# Patient Record
Sex: Male | Born: 1980 | Race: White | Hispanic: No | Marital: Single | Smoking: Current every day smoker
Health system: Southern US, Community
[De-identification: ages and names within clinical notes are randomized; demographics above are authoritative.]

## PROBLEM LIST (undated history)

## (undated) DIAGNOSIS — F319 Bipolar disorder, unspecified: Secondary | ICD-10-CM

## (undated) DIAGNOSIS — F410 Panic disorder [episodic paroxysmal anxiety] without agoraphobia: Secondary | ICD-10-CM

## (undated) DIAGNOSIS — G4730 Sleep apnea, unspecified: Secondary | ICD-10-CM

## (undated) DIAGNOSIS — F32A Depression, unspecified: Secondary | ICD-10-CM

## (undated) DIAGNOSIS — K219 Gastro-esophageal reflux disease without esophagitis: Secondary | ICD-10-CM

## (undated) DIAGNOSIS — E785 Hyperlipidemia, unspecified: Secondary | ICD-10-CM

## (undated) HISTORY — PX: ORIF RADIAL FRACTURE: SHX5113

## (undated) HISTORY — PX: SEPTOPLASTY: SUR1290

## (undated) HISTORY — PX: TONSILLECTOMY: SUR1361

## (undated) HISTORY — PX: ORIF CLAVICLE FRACTURE: SUR924

---

## 2007-01-06 HISTORY — PX: ORIF RADIAL FRACTURE: SHX5113

## 2007-01-06 HISTORY — PX: ORIF CLAVICLE FRACTURE: SUR924

## 2010-05-19 ENCOUNTER — Emergency Department (HOSPITAL_COMMUNITY): Payer: Worker's Compensation

## 2010-05-19 ENCOUNTER — Emergency Department (HOSPITAL_COMMUNITY)
Admission: EM | Admit: 2010-05-19 | Discharge: 2010-05-19 | Disposition: A | Discharge status: Home / Self Care | Payer: Worker's Compensation | Attending: Emergency Medicine | Admitting: Emergency Medicine

## 2010-05-19 DIAGNOSIS — IMO0002 Reserved for concepts with insufficient information to code with codable children: Secondary | ICD-10-CM | POA: Insufficient documentation

## 2010-05-19 DIAGNOSIS — M79609 Pain in unspecified limb: Secondary | ICD-10-CM | POA: Insufficient documentation

## 2010-05-19 DIAGNOSIS — Y990 Civilian activity done for income or pay: Secondary | ICD-10-CM | POA: Insufficient documentation

## 2010-05-19 DIAGNOSIS — M25579 Pain in unspecified ankle and joints of unspecified foot: Secondary | ICD-10-CM | POA: Insufficient documentation

## 2010-05-19 DIAGNOSIS — S9000XA Contusion of unspecified ankle, initial encounter: Secondary | ICD-10-CM | POA: Insufficient documentation

## 2010-05-19 DIAGNOSIS — S8010XA Contusion of unspecified lower leg, initial encounter: Secondary | ICD-10-CM | POA: Insufficient documentation

## 2010-05-19 DIAGNOSIS — M7989 Other specified soft tissue disorders: Secondary | ICD-10-CM | POA: Insufficient documentation

## 2012-02-19 IMAGING — CR DG TIBIA/FIBULA 2V*L*
4 series · 4 of 4 positions shown · non-contrast
Comparison: None.

CLINICAL DATA: Blunt trauma

LEFT TIBIA AND FIBULA - 2 VIEW

[t tib/fib ap left (1 of 2)]
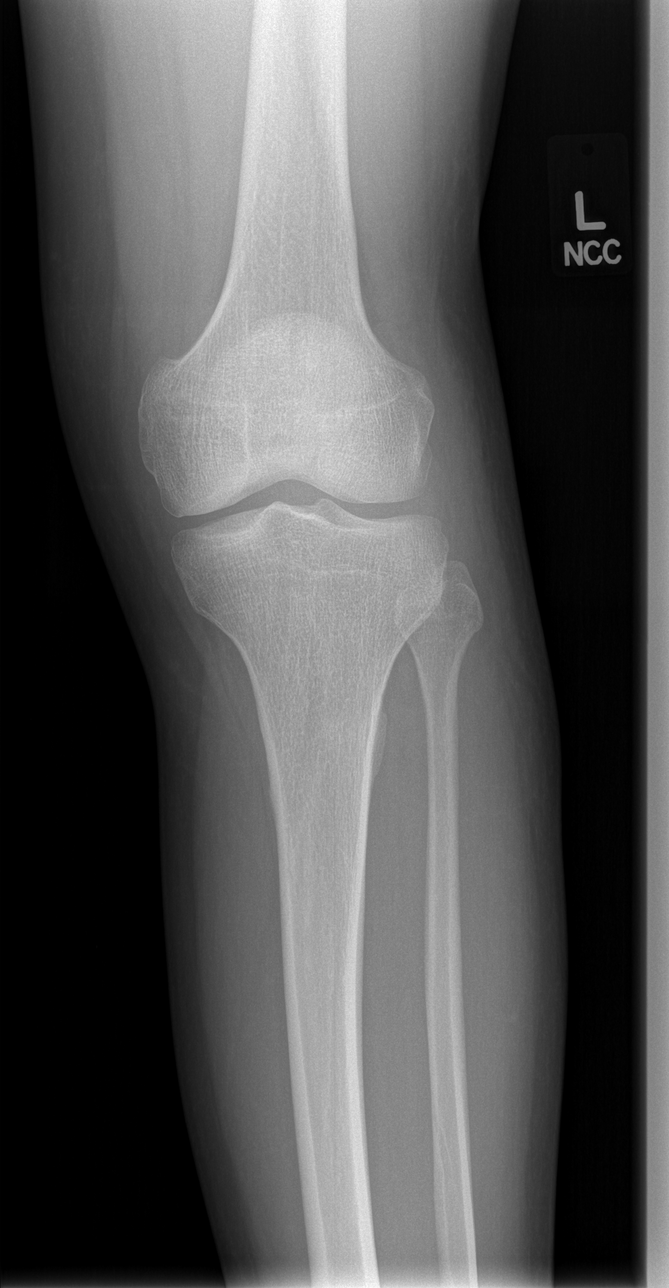

[t tib/fib ap left (2 of 2)]
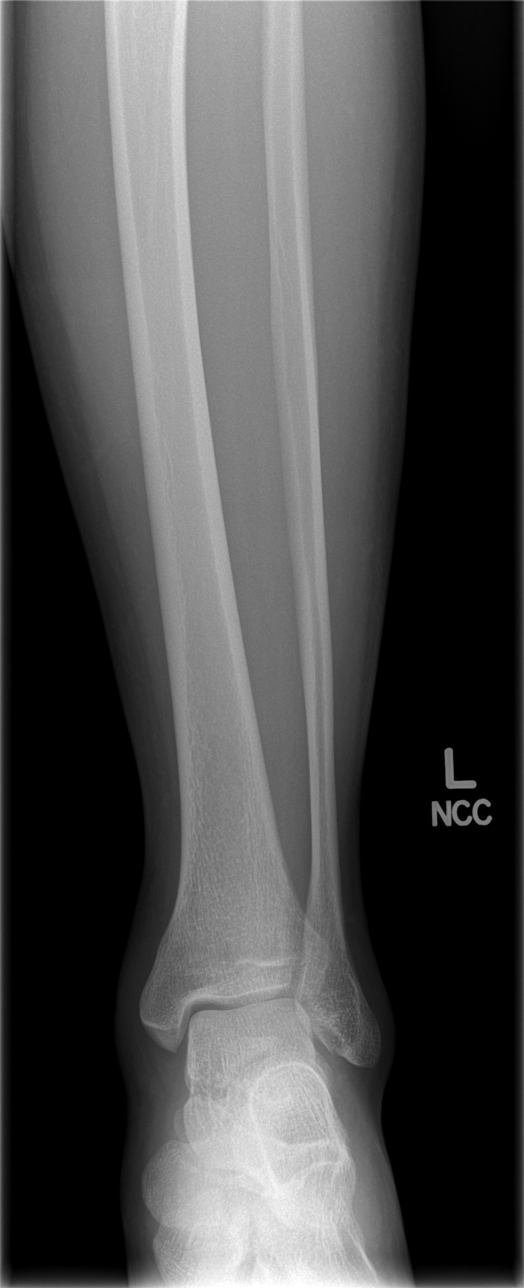

[t tib/fib lat left (1 of 2)]
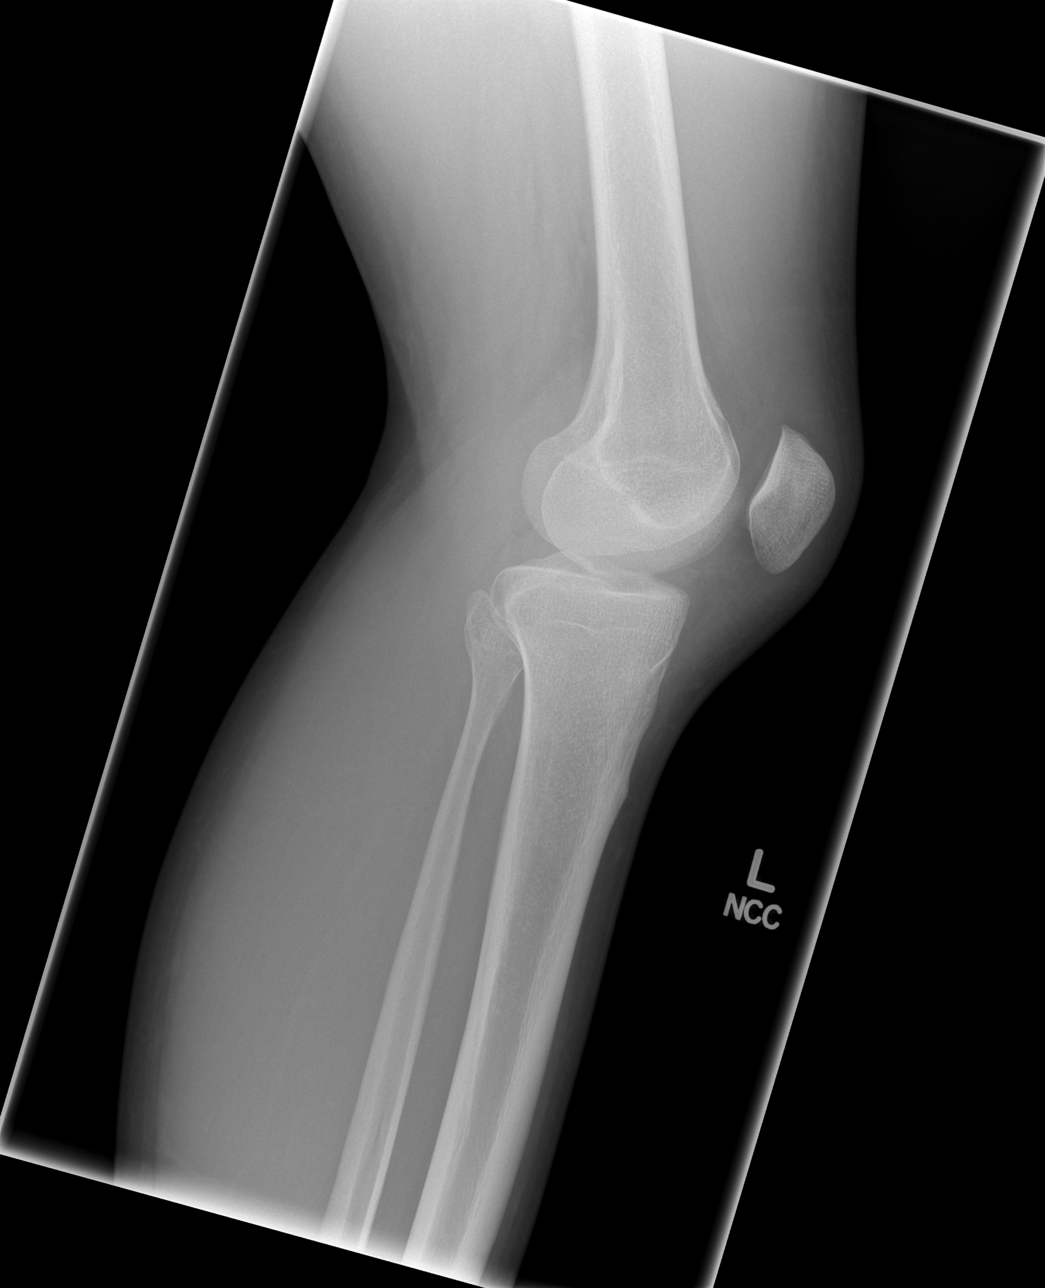

[t tib/fib lat left (2 of 2)]
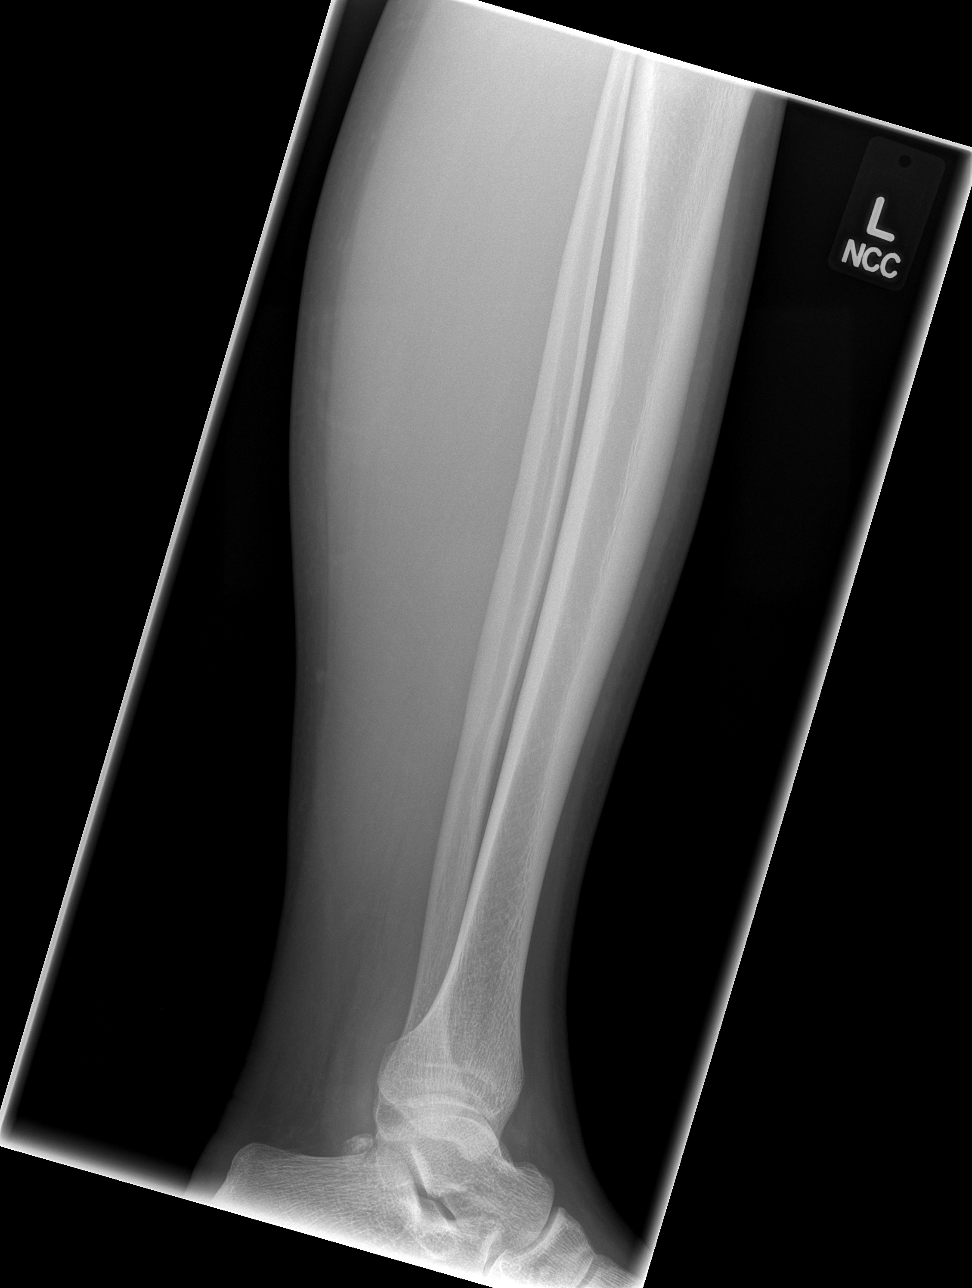

[4 of 4 positions shown; findings below may reference images not displayed]

FINDINGS: No acute fracture and no dislocation.  Unremarkable soft
tissues.
IMPRESSION: No acute bony injury.

## 2013-01-05 HISTORY — PX: SEPTOPLASTY: SUR1290

## 2018-01-05 HISTORY — PX: FRACTURE SURGERY: SHX138

## 2021-09-19 ENCOUNTER — Other Ambulatory Visit: Payer: Self-pay | Admitting: Otolaryngology

## 2021-09-22 ENCOUNTER — Encounter (HOSPITAL_BASED_OUTPATIENT_CLINIC_OR_DEPARTMENT_OTHER): Payer: Self-pay | Admitting: Otolaryngology

## 2021-09-22 ENCOUNTER — Other Ambulatory Visit: Payer: Self-pay

## 2021-10-01 ENCOUNTER — Encounter (HOSPITAL_BASED_OUTPATIENT_CLINIC_OR_DEPARTMENT_OTHER): Payer: Self-pay | Admitting: Otolaryngology

## 2021-10-01 ENCOUNTER — Other Ambulatory Visit: Payer: Self-pay

## 2021-10-01 ENCOUNTER — Ambulatory Visit (HOSPITAL_COMMUNITY)
Admission: RE | Admit: 2021-10-01 | Discharge: 2021-10-01 | Disposition: A | Discharge status: Home / Self Care | Payer: Medicare HMO | Attending: Otolaryngology | Admitting: Otolaryngology

## 2021-10-01 ENCOUNTER — Ambulatory Visit (HOSPITAL_BASED_OUTPATIENT_CLINIC_OR_DEPARTMENT_OTHER): Payer: Medicare HMO | Admitting: Anesthesiology

## 2021-10-01 ENCOUNTER — Encounter (HOSPITAL_BASED_OUTPATIENT_CLINIC_OR_DEPARTMENT_OTHER)
Admission: RE | Disposition: A | Discharge status: Home / Self Care | Payer: Self-pay | Source: Home / Self Care | Attending: Otolaryngology

## 2021-10-01 DIAGNOSIS — F17200 Nicotine dependence, unspecified, uncomplicated: Secondary | ICD-10-CM | POA: Diagnosis not present

## 2021-10-01 DIAGNOSIS — G4733 Obstructive sleep apnea (adult) (pediatric): Secondary | ICD-10-CM | POA: Insufficient documentation

## 2021-10-01 DIAGNOSIS — F319 Bipolar disorder, unspecified: Secondary | ICD-10-CM | POA: Insufficient documentation

## 2021-10-01 DIAGNOSIS — E669 Obesity, unspecified: Secondary | ICD-10-CM | POA: Insufficient documentation

## 2021-10-01 DIAGNOSIS — F419 Anxiety disorder, unspecified: Secondary | ICD-10-CM | POA: Diagnosis not present

## 2021-10-01 DIAGNOSIS — F17210 Nicotine dependence, cigarettes, uncomplicated: Secondary | ICD-10-CM

## 2021-10-01 DIAGNOSIS — K219 Gastro-esophageal reflux disease without esophagitis: Secondary | ICD-10-CM | POA: Insufficient documentation

## 2021-10-01 DIAGNOSIS — J392 Other diseases of pharynx: Secondary | ICD-10-CM | POA: Insufficient documentation

## 2021-10-01 HISTORY — PX: DRUG INDUCED ENDOSCOPY: SHX6808

## 2021-10-01 HISTORY — DX: Panic disorder (episodic paroxysmal anxiety): F41.0

## 2021-10-01 HISTORY — DX: Sleep apnea, unspecified: G47.30

## 2021-10-01 HISTORY — DX: Bipolar disorder, unspecified: F31.9

## 2021-10-01 HISTORY — DX: Hyperlipidemia, unspecified: E78.5

## 2021-10-01 SURGERY — DRUG INDUCED SLEEP ENDOSCOPY
Anesthesia: Monitor Anesthesia Care | Site: Nose | Laterality: Bilateral

## 2021-10-01 MED ORDER — LIDOCAINE HCL (CARDIAC) PF 100 MG/5ML IV SOSY
PREFILLED_SYRINGE | INTRAVENOUS | Status: DC | PRN
  Administered 2021-10-01: 40 mg via INTRAVENOUS

## 2021-10-01 MED ORDER — PROPOFOL 500 MG/50ML IV EMUL
INTRAVENOUS | Status: AC
  Filled 2021-10-01: qty 50

## 2021-10-01 MED ORDER — LACTATED RINGERS IV SOLN: INTRAVENOUS | Status: DC

## 2021-10-01 MED ORDER — LIDOCAINE-EPINEPHRINE 1 %-1:100000 IJ SOLN
INTRAMUSCULAR | Status: AC
  Filled 2021-10-01: qty 1

## 2021-10-01 MED ORDER — PROPOFOL 500 MG/50ML IV EMUL
INTRAVENOUS | Status: DC | PRN
  Administered 2021-10-01: 50 ug/kg/min via INTRAVENOUS

## 2021-10-01 MED ORDER — OXYMETAZOLINE HCL 0.05 % NA SOLN
NASAL | Status: AC
  Filled 2021-10-01: qty 30

## 2021-10-01 MED ORDER — ONDANSETRON HCL 4 MG/2ML IJ SOLN
INTRAMUSCULAR | Status: DC | PRN
  Administered 2021-10-01: 4 mg via INTRAVENOUS

## 2021-10-01 MED ORDER — OXYMETAZOLINE HCL 0.05 % NA SOLN
NASAL | Status: DC | PRN
  Administered 2021-10-01: 1 via TOPICAL

## 2021-10-01 MED ORDER — PROPOFOL 10 MG/ML IV BOLUS
INTRAVENOUS | Status: DC | PRN
  Administered 2021-10-01 (×2): 10 mg via INTRAVENOUS
  Administered 2021-10-01 (×2): 20 mg via INTRAVENOUS
  Administered 2021-10-01: 10 mg via INTRAVENOUS

## 2021-10-01 MED ORDER — LIDOCAINE 2% (20 MG/ML) 5 ML SYRINGE
INTRAMUSCULAR | Status: AC
  Filled 2021-10-01: qty 5

## 2021-10-01 SURGICAL SUPPLY — 12 items
CANISTER SUCT 1200ML W/VALVE (MISCELLANEOUS) ×1 IMPLANT
GLOVE BIO SURGEON STRL SZ7.5 (GLOVE) ×1 IMPLANT
KIT CLEAN ENDO (MISCELLANEOUS) ×1 IMPLANT
NDL HYPO 27GX1-1/4 (NEEDLE) IMPLANT
NEEDLE HYPO 27GX1-1/4 (NEEDLE) IMPLANT
PATTIES SURGICAL .5 X3 (DISPOSABLE) ×1 IMPLANT
SHEET MEDIUM DRAPE 40X70 STRL (DRAPES) ×1 IMPLANT
SOL ANTI FOG 6CC (MISCELLANEOUS) ×1 IMPLANT
SOLUTION ANTI FOG 6CC (MISCELLANEOUS) ×1
SYR CONTROL 10ML LL (SYRINGE) IMPLANT
TOWEL GREEN STERILE FF (TOWEL DISPOSABLE) ×1 IMPLANT
TUBE CONNECTING 20X1/4 (TUBING) ×1 IMPLANT

## 2021-10-01 NOTE — H&P (Signed)
Brian Pruitt is an 41 y.o. male.   Chief Complaint: Sleep apnea HPI: 41 year old male with obstructive sleep apnea who has been unable to tolerate CPAP.  Past Medical History:  Diagnosis Date   Bipolar disorder ([place])    HLD (hyperlipidemia)    Panic disorder    Sleep apnea     Past Surgical History:  Procedure Laterality Date   ORIF CLAVICLE FRACTURE     ORIF RADIAL FRACTURE     SEPTOPLASTY     TONSILLECTOMY      History reviewed. No pertinent family history. Social History:  reports that he has been smoking cigarettes. He uses smokeless tobacco. He reports current alcohol use. He reports that he does not use drugs.  Allergies:  Allergies  Allergen Reactions   Ceclor [Cefaclor]    Septra [Sulfamethoxazole-Trimethoprim]    Trimethoprim     Medications Prior to Admission  Medication Sig Dispense Refill   ALPRAZolam (XANAX) 0.5 MG tablet Take 0.5 mg by mouth at bedtime as needed for anxiety.     atorvastatin (LIPITOR) 40 MG tablet Take 40 mg by mouth daily.     cariprazine (VRAYLAR) 3 MG capsule Take by mouth.     divalproex (DEPAKOTE ER) 500 MG 24 hr tablet Take by mouth daily.     Multiple Vitamin (MULTIVITAMIN) capsule Take 1 capsule by mouth daily.     pantoprazole (PROTONIX) 40 MG tablet Take 40 mg by mouth daily.     QUEtiapine (SEROQUEL XR) 300 MG 24 hr tablet Take 300 mg by mouth at bedtime.     sertraline (ZOLOFT) 100 MG tablet Take 100 mg by mouth daily.      No results found for this or any previous visit (from the past 48 hour(s)). No results found.  Review of Systems  All other systems reviewed and are negative.   Blood pressure (!) 130/97, pulse (!) 108, temperature 98.2 F (36.8 C), temperature source Oral, resp. rate 18, height 5\' 11"  (1.803 m), weight 103.3 kg, SpO2 99 %. Physical Exam Constitutional:      Appearance: Normal appearance. He is normal weight.  HENT:     Head: Normocephalic and atraumatic.     Right Ear: External ear normal.      Left Ear: External ear normal.     Nose: Nose normal.     Mouth/Throat:     Mouth: Mucous membranes are moist.     Pharynx: Oropharynx is clear.  Eyes:     Extraocular Movements: Extraocular movements intact.     Conjunctiva/sclera: Conjunctivae normal.     Pupils: Pupils are equal, round, and reactive to light.  Cardiovascular:     Rate and Rhythm: Normal rate.  Pulmonary:     Effort: Pulmonary effort is normal.  Musculoskeletal:     Cervical back: Normal range of motion.  Skin:    General: Skin is warm and dry.  Neurological:     General: No focal deficit present.     Mental Status: He is alert and oriented to person, place, and time.  Psychiatric:        Mood and Affect: Mood normal.        Behavior: Behavior normal.        Thought Content: Thought content normal.        Judgment: Judgment normal.      Assessment/Plan Obstructive sleep apnea and BMI 31.76  To OR for sleep endoscopy.  Melida Quitter, MD 10/01/2021, 7:33 AM

## 2021-10-01 NOTE — Op Note (Signed)
Preop diagnosis: Obstructive sleep apnea Postop diagnosis: same Procedure: Drug-induced sleep endoscopy Surgeon: Caelie Remsburg Anesth: IV sedation Compl: None Findings: There is 100% anterior-posterior collapse at the velum making him a candidate for hypoglossal nerve stimulator placement.  There was also anterior-posterior collapse at the tongue base. Description:  After discussing risks, benefits, and alternatives, the patient was brought to the operative suite and placed on the operative table in the supine position.  Anesthesia was induced and the patient was given light sedation to simulate natural sleep. When the proper level was reached, an Afrin-soaked pledget was placed in the right nasal passage for a couple of minutes and then removed.  The fiberoptic laryngoscope was then passed to view the pharynx and larynx.  Findings are noted above and the exam was recorded.  After completion, the scope was removed and the patient was returned to anesthesia for wakeup and was moved to the recovery room in stable condition.  

## 2021-10-01 NOTE — Discharge Instructions (Signed)

## 2021-10-01 NOTE — Anesthesia Preprocedure Evaluation (Addendum)
Anesthesia Evaluation  Patient identified by MRN, date of birth, ID band Patient awake    Reviewed: Allergy & Precautions, NPO status , Patient's Chart, lab work & pertinent test results  Airway Mallampati: II  TM Distance: <3 FB Neck ROM: Full    Dental  (+) Teeth Intact, Dental Advisory Given   Pulmonary sleep apnea , Current Smoker and Patient abstained from smoking.,    Pulmonary exam normal breath sounds clear to auscultation       Cardiovascular negative cardio ROS Normal cardiovascular exam Rhythm:Regular Rate:Normal     Neuro/Psych PSYCHIATRIC DISORDERS Anxiety Bipolar Disorder negative neurological ROS     GI/Hepatic Neg liver ROS, GERD  Medicated,  Endo/Other  negative endocrine ROSObesity   Renal/GU negative Renal ROS     Musculoskeletal negative musculoskeletal ROS (+)   Abdominal   Peds  Hematology negative hematology ROS (+)   Anesthesia Other Findings Day of surgery medications reviewed with the patient.  Reproductive/Obstetrics                            Anesthesia Physical Anesthesia Plan  ASA: 2  Anesthesia Plan: MAC   Post-op Pain Management: Minimal or no pain anticipated   Induction: Intravenous  PONV Risk Score and Plan: 0 and TIVA and Treatment may vary due to age or medical condition  Airway Management Planned: Nasal Cannula and Natural Airway  Additional Equipment:   Intra-op Plan:   Post-operative Plan:   Informed Consent: I have reviewed the patients History and Physical, chart, labs and discussed the procedure including the risks, benefits and alternatives for the proposed anesthesia with the patient or authorized representative who has indicated his/her understanding and acceptance.     Dental advisory given  Plan Discussed with: CRNA and Anesthesiologist  Anesthesia Plan Comments:         Anesthesia Quick Evaluation

## 2021-10-01 NOTE — Anesthesia Postprocedure Evaluation (Signed)
Anesthesia Post Note  Patient: Brian Pruitt  Procedure(s) Performed: DRUG INDUCED ENDOSCOPY (Bilateral: Nose)     Patient location during evaluation: PACU Anesthesia Type: MAC Level of consciousness: awake and alert Pain management: pain level controlled Vital Signs Assessment: post-procedure vital signs reviewed and stable Respiratory status: spontaneous breathing, nonlabored ventilation, respiratory function stable and patient connected to nasal cannula oxygen Cardiovascular status: stable and blood pressure returned to baseline Postop Assessment: no apparent nausea or vomiting Anesthetic complications: no   No notable events documented.  Last Vitals:  Vitals:   10/01/21 0800 10/01/21 0817  BP: 137/76 (!) 141/91  Pulse: (!) 102 87  Resp: 19 20  Temp:  36.8 C  SpO2: 94% 96%    Last Pain:  Vitals:   10/01/21 0817  TempSrc:   PainSc: 0-No pain                 Santa Lighter

## 2021-10-01 NOTE — Transfer of Care (Signed)
Immediate Anesthesia Transfer of Care Note  Patient: Brian Pruitt  Procedure(s) Performed: DRUG INDUCED ENDOSCOPY (Bilateral: Nose)  Patient Location: PACU  Anesthesia Type:MAC  Level of Consciousness: awake, alert  and oriented  Airway & Oxygen Therapy: Patient Spontanous Breathing and Patient connected to face mask oxygen  Post-op Assessment: Report given to RN and Post -op Vital signs reviewed and stable  Post vital signs: Reviewed and stable  Last Vitals:   BP: 132/81 (96) Vitals Value Taken Time  BP    Temp 36.7 C 10/01/21 0756  Pulse 105 10/01/21 0756  Resp    SpO2 98 % 10/01/21 0756    Last Pain:  Vitals:   10/01/21 0647  TempSrc: Oral  PainSc: 0-No pain      Patients Stated Pain Goal: 3 (67/73/73 6681)  Complications: No notable events documented.

## 2021-10-01 NOTE — Brief Op Note (Signed)
10/01/2021  8:01 AM  PATIENT:  Brian Pruitt  41 y.o. male  PRE-OPERATIVE DIAGNOSIS:  Obstructive Sleep Apnea BMI 32.0-32.9,adult  POST-OPERATIVE DIAGNOSIS:  Obstructive Sleep Apnea BMI 32.0-32.9,adult  PROCEDURE:  Procedure(s): DRUG INDUCED ENDOSCOPY (Bilateral)  SURGEON:  Surgeon(s) and Role:    Melida Quitter, MD - Primary  PHYSICIAN ASSISTANT:   ASSISTANTS: none   ANESTHESIA:   IV sedation  EBL:  None   BLOOD ADMINISTERED:none  DRAINS: none   LOCAL MEDICATIONS USED:  NONE  SPECIMEN:  No Specimen  DISPOSITION OF SPECIMEN:  N/A  COUNTS:  YES  TOURNIQUET:  * No tourniquets in log *  DICTATION: .Note written in EPIC  PLAN OF CARE: Discharge to home after PACU  PATIENT DISPOSITION:  PACU - hemodynamically stable.   Delay start of Pharmacological VTE agent (>24hrs) due to surgical blood loss or risk of bleeding: no

## 2021-10-02 ENCOUNTER — Encounter (HOSPITAL_BASED_OUTPATIENT_CLINIC_OR_DEPARTMENT_OTHER): Payer: Self-pay | Admitting: Otolaryngology

## 2021-10-29 ENCOUNTER — Other Ambulatory Visit: Payer: Self-pay | Admitting: Otolaryngology

## 2021-11-21 NOTE — Progress Notes (Signed)
Surgical Instructions    Your procedure is scheduled on Wednesday November 29.  Report to [facility] Main Entrance "A" at 7:45 A.M., then check in with the Admitting office.  Call this number if you have problems the morning of surgery:  [phone]   If you have any questions prior to your surgery date call [phone]: Open Monday-Friday 8am-4pm If you experience any cold or flu symptoms such as cough, fever, chills, shortness of breath, etc. between now and your scheduled surgery, please notify us at the above number     Remember:  Do not eat after midnight the night before your surgery  You may drink clear liquids until 6:45AM the morning of your surgery.   Clear liquids allowed are: Water, Non-Citrus Juices (without pulp), Carbonated Beverages, Clear Tea, Black Coffee ONLY (NO MILK, CREAM OR POWDERED CREAMER of any kind), and Gatorade    Take these medicines the morning of surgery with A SIP OF WATER: pantoprazole (PROTONIX)   sertraline (ZOLOFT)  ALPRAZolam (XANAX)  if needed   As of today, STOP taking any Aspirin (unless otherwise instructed by your surgeon) Aleve, Naproxen, Ibuprofen, Motrin, Advil, Goody's, BC's, all herbal medications, fish oil, and all vitamins.           [facility] is not responsible for any belongings or valuables.    Do NOT Smoke (Tobacco/Vaping)  24 hours prior to your procedure  If you use a CPAP at night, you may bring your mask for your overnight stay.   Contacts, glasses, hearing aids, dentures or partials may not be worn into surgery, please bring cases for these belongings   For patients admitted to the hospital, discharge time will be determined by your treatment team.   Patients discharged the day of surgery will not be allowed to drive home, and someone needs to stay with them for 24 hours.   SURGICAL WAITING ROOM VISITATION Patients having surgery or a procedure may have no more than 2 support people in the waiting area - these  visitors may rotate.   Children under the age of 16 must have an adult with them who is not the patient. If the patient needs to stay at the hospital during part of their recovery, the visitor guidelines for inpatient rooms apply. Pre-op nurse will coordinate an appropriate time for 1 support person to accompany patient in pre-op.  This support person may not rotate.   Please refer to [email or web address] for the visitor guidelines for Inpatients (after your surgery is over and you are in a regular room).    Special instructions:    Oral Hygiene is also important to reduce your risk of infection.  Remember - BRUSH YOUR TEETH THE MORNING OF SURGERY WITH YOUR REGULAR TOOTHPASTE   [facility]- Preparing For Surgery  Before surgery, you can play an important role. Because skin is not sterile, your skin needs to be as free of germs as possible. You can reduce the number of germs on your skin by washing with CHG (chlorahexidine gluconate) Soap before surgery.  CHG is an antiseptic cleaner which kills germs and bonds with the skin to continue killing germs even after washing.     Please do not use if you have an allergy to CHG or antibacterial soaps. If your skin becomes reddened/irritated stop using the CHG.  Do not shave (including legs and underarms) for at least 48 hours prior to first CHG shower. It is OK to shave your face.  Please follow these instructions   carefully.     Shower the NIGHT BEFORE SURGERY and the MORNING OF SURGERY with CHG Soap.   If you chose to wash your hair, wash your hair first as usual with your normal shampoo. After you shampoo, rinse your hair and body thoroughly to remove the shampoo.  Then [employer] and genitals (private parts) with your normal soap and rinse thoroughly to remove soap.  After that Use CHG Soap as you would any other liquid soap. You can apply CHG directly to the skin and wash gently with a scrungie  or a clean washcloth.   Apply the CHG Soap to your body ONLY FROM THE NECK DOWN.  Do not use on open wounds or open sores. Avoid contact with your eyes, ears, mouth and genitals (private parts). Wash Face and genitals (private parts)  with your normal soap.   Wash thoroughly, paying special attention to the area where your surgery will be performed.  Thoroughly rinse your body with warm water from the neck down.  DO NOT shower/wash with your normal soap after using and rinsing off the CHG Soap.  Pat yourself dry with a CLEAN TOWEL.  Wear CLEAN PAJAMAS to bed the night before surgery  Place CLEAN SHEETS on your bed the night before your surgery  DO NOT SLEEP WITH PETS.   Day of Surgery:  Take a shower with CHG soap. Wear Clean/Comfortable clothing the morning of surgery Do not wear jewelry or makeup. Do not wear lotions, powders, perfumes/cologne or deodorant. Do not shave 48 hours prior to surgery.  Men may shave face and neck. Do not bring valuables to the hospital. Do not wear nail polish, gel polish, artificial nails, or any other type of covering on natural nails (fingers and toes) If you have artificial nails or gel coating that need to be removed by a nail salon, please have this removed prior to surgery. Artificial nails or gel coating may interfere with anesthesia's ability to adequately monitor your vital signs. Remember to brush your teeth WITH YOUR REGULAR TOOTHPASTE.    If you received a COVID test during your pre-op visit, it is requested that you wear a mask when out in public, stay away from anyone that may not be feeling well, and notify your surgeon if you develop symptoms. If you have been in contact with anyone that has tested positive in the last 10 days, please notify your surgeon.    Please read over the following fact sheets that you were given.

## 2021-11-24 ENCOUNTER — Other Ambulatory Visit: Payer: Self-pay

## 2021-11-24 ENCOUNTER — Encounter (HOSPITAL_COMMUNITY): Payer: Self-pay

## 2021-11-24 ENCOUNTER — Encounter (HOSPITAL_COMMUNITY)
Admission: RE | Admit: 2021-11-24 | Discharge: 2021-11-24 | Disposition: A | Discharge status: Home / Self Care | Payer: Medicare HMO | Source: Ambulatory Visit | Attending: Otolaryngology | Admitting: Otolaryngology

## 2021-11-24 VITALS — BP 138/94 | HR 103 | Temp 97.7°F | Resp 18 | Ht 71.0 in | Wt 225.8 lb

## 2021-11-24 DIAGNOSIS — Z01818 Encounter for other preprocedural examination: Secondary | ICD-10-CM

## 2021-11-24 DIAGNOSIS — Z01812 Encounter for preprocedural laboratory examination: Secondary | ICD-10-CM | POA: Diagnosis present

## 2021-11-24 HISTORY — DX: Depression, unspecified: F32.A

## 2021-11-24 HISTORY — DX: Gastro-esophageal reflux disease without esophagitis: K21.9

## 2021-11-24 LAB — CBC
HCT: 46.7 % (ref 39.0–52.0)
Hemoglobin: 16 g/dL (ref 13.0–17.0)
MCH: 28.5 pg (ref 26.0–34.0)
MCHC: 34.3 g/dL (ref 30.0–36.0)
MCV: 83.2 fL (ref 80.0–100.0)
Platelets: 165 10*3/uL (ref 150–400)
RBC: 5.61 MIL/uL (ref 4.22–5.81)
RDW: 13.2 % (ref 11.5–15.5)
WBC: 3.7 10*3/uL — ABNORMAL LOW (ref 4.0–10.5)
nRBC: 0 % (ref 0.0–0.2)

## 2021-11-24 NOTE — Progress Notes (Signed)
PCP - Dr. Marvis Repress Cardiologist - Denies  PPM/ICD - Denies Device Orders - n/a Rep Notified - n/a  Chest x-ray - n/a EKG - Per pt, had one back in 2017 r/t CP which ended up being related to a panic attack. Stress Test - Denies ECHO - Denies Cardiac Cath - Denies  Sleep Study - Positive OSA 2021 CPAP - Unable to tolerate CPAP d/t night sweats and claustrophobia. Has not worn in months/already returned it to company.  No DM  Last dose of GLP1 agonist- n/a GLP1 instructions: n/a  Blood Thinner Instructions: n/a Aspirin Instructions: n/a  ERAS Protcol - Clear liquids until 52778 morning of surgery PRE-SURGERY Ensure or G2- n/a. None ordered  COVID TEST- n/a   Anesthesia review: No.  Patient denies shortness of breath, fever, cough and chest pain at PAT appointment   All instructions explained to the patient, with a verbal understanding of the material. Patient agrees to go over the instructions while at home for a better understanding. Patient also instructed to self quarantine after being tested for COVID-19. The opportunity to ask questions was provided.

## 2021-11-24 NOTE — Progress Notes (Signed)
Surgical Instructions    Your procedure is scheduled on Wednesday November 29.  Report to [facility] Main Entrance "A" at 7:45 A.M., then check in with the Admitting office.  Call this number if you have problems the morning of surgery:  [phone]   If you have any questions prior to your surgery date call [phone]: Open Monday-Friday 8am-4pm If you experience any cold or flu symptoms such as cough, fever, chills, shortness of breath, etc. between now and your scheduled surgery, please notify us at the above number     Remember:  Do not eat after midnight the night before your surgery  You may drink clear liquids until 6:45AM the morning of your surgery.   Clear liquids allowed are: Water, Non-Citrus Juices (without pulp), Carbonated Beverages, Clear Tea, Black Coffee ONLY (NO MILK, CREAM OR POWDERED CREAMER of any kind), and Gatorade    Take these medicines the morning of surgery with A SIP OF WATER: pantoprazole (PROTONIX)   sertraline (ZOLOFT)  ALPRAZolam (XANAX)  if needed   As of today, STOP taking any Aspirin (unless otherwise instructed by your surgeon) Aleve, Naproxen, Ibuprofen, Motrin, Advil, Goody's, BC's, all herbal medications, fish oil, and all vitamins.           [facility] is not responsible for any belongings or valuables.    Do NOT Smoke (Tobacco/Vaping)  24 hours prior to your procedure  If you use a CPAP at night, you may bring your mask for your overnight stay.   Contacts, glasses, hearing aids, dentures or partials may not be worn into surgery, please bring cases for these belongings   For patients admitted to the hospital, discharge time will be determined by your treatment team.   Patients discharged the day of surgery will not be allowed to drive home, and someone needs to stay with them for 24 hours.   SURGICAL WAITING ROOM VISITATION Patients having surgery or a procedure may have no more than 2 support people in the waiting area - these  visitors may rotate.   Children under the age of 81 must have an adult with them who is not the patient. If the patient needs to stay at the hospital during part of their recovery, the visitor guidelines for inpatient rooms apply. Pre-op nurse will coordinate an appropriate time for 1 support person to accompany patient in pre-op.  This support person may not rotate.   Please refer to [email or web address] for the visitor guidelines for Inpatients (after your surgery is over and you are in a regular room).    Special instructions:    Oral Hygiene is also important to reduce your risk of infection.  Remember - BRUSH YOUR TEETH THE MORNING OF SURGERY WITH YOUR REGULAR TOOTHPASTE   [facility]- Preparing For Surgery  Before surgery, you can play an important role. Because skin is not sterile, your skin needs to be as free of germs as possible. You can reduce the number of germs on your skin by washing with CHG (chlorahexidine gluconate) Soap before surgery.  CHG is an antiseptic cleaner which kills germs and bonds with the skin to continue killing germs even after washing.     Please do not use if you have an allergy to CHG or antibacterial soaps. If your skin becomes reddened/irritated stop using the CHG.  Do not shave (including legs and underarms) for at least 48 hours prior to first CHG shower. It is OK to shave your face.  Please follow these instructions  carefully.     Shower the NIGHT BEFORE SURGERY and the MORNING OF SURGERY with CHG Soap.   If you chose to wash your hair, wash your hair first as usual with your normal shampoo. After you shampoo, rinse your hair and body thoroughly to remove the shampoo.  Then [employer] and genitals (private parts) with your normal soap and rinse thoroughly to remove soap.  After that Use CHG Soap as you would any other liquid soap. You can apply CHG directly to the skin and wash gently with a scrungie  or a clean washcloth.   Apply the CHG Soap to your body ONLY FROM THE NECK DOWN.  Do not use on open wounds or open sores. Avoid contact with your eyes, ears, mouth and genitals (private parts). Wash Face and genitals (private parts)  with your normal soap.   Wash thoroughly, paying special attention to the area where your surgery will be performed.  Thoroughly rinse your body with warm water from the neck down.  DO NOT shower/wash with your normal soap after using and rinsing off the CHG Soap.  Pat yourself dry with a CLEAN TOWEL.  Wear CLEAN PAJAMAS to bed the night before surgery  Place CLEAN SHEETS on your bed the night before your surgery  DO NOT SLEEP WITH PETS.   Day of Surgery:  Take a shower with CHG soap. Wear Clean/Comfortable clothing the morning of surgery Do not wear jewelry or makeup. Do not wear lotions, powders, perfumes/cologne or deodorant. Do not shave 48 hours prior to surgery.  Men may shave face and neck. Do not bring valuables to the hospital. Do not wear nail polish, gel polish, artificial nails, or any other type of covering on natural nails (fingers and toes) If you have artificial nails or gel coating that need to be removed by a nail salon, please have this removed prior to surgery. Artificial nails or gel coating may interfere with anesthesia's ability to adequately monitor your vital signs. Remember to brush your teeth WITH YOUR REGULAR TOOTHPASTE.    If you received a COVID test during your pre-op visit, it is requested that you wear a mask when out in public, stay away from anyone that may not be feeling well, and notify your surgeon if you develop symptoms. If you have been in contact with anyone that has tested positive in the last 10 days, please notify your surgeon.    Please read over the following fact sheets that you were given.

## 2021-12-03 ENCOUNTER — Ambulatory Visit (HOSPITAL_BASED_OUTPATIENT_CLINIC_OR_DEPARTMENT_OTHER): Payer: Medicare HMO | Admitting: Anesthesiology

## 2021-12-03 ENCOUNTER — Ambulatory Visit (HOSPITAL_COMMUNITY): Payer: Medicare HMO | Admitting: Anesthesiology

## 2021-12-03 ENCOUNTER — Other Ambulatory Visit: Payer: Self-pay

## 2021-12-03 ENCOUNTER — Encounter (HOSPITAL_COMMUNITY)
Admission: RE | Disposition: A | Discharge status: Home / Self Care | Payer: Self-pay | Source: Ambulatory Visit | Attending: Otolaryngology

## 2021-12-03 ENCOUNTER — Ambulatory Visit (HOSPITAL_COMMUNITY)
Admission: RE | Admit: 2021-12-03 | Discharge: 2021-12-03 | Disposition: A | Discharge status: Home / Self Care | Payer: Medicare HMO | Source: Ambulatory Visit | Attending: Otolaryngology | Admitting: Otolaryngology

## 2021-12-03 ENCOUNTER — Encounter (HOSPITAL_COMMUNITY): Payer: Self-pay | Admitting: Otolaryngology

## 2021-12-03 ENCOUNTER — Ambulatory Visit (HOSPITAL_COMMUNITY): Payer: Medicare HMO

## 2021-12-03 DIAGNOSIS — G4733 Obstructive sleep apnea (adult) (pediatric): Secondary | ICD-10-CM | POA: Insufficient documentation

## 2021-12-03 DIAGNOSIS — F17210 Nicotine dependence, cigarettes, uncomplicated: Secondary | ICD-10-CM | POA: Insufficient documentation

## 2021-12-03 DIAGNOSIS — Z6831 Body mass index (BMI) 31.0-31.9, adult: Secondary | ICD-10-CM | POA: Diagnosis not present

## 2021-12-03 DIAGNOSIS — K219 Gastro-esophageal reflux disease without esophagitis: Secondary | ICD-10-CM | POA: Insufficient documentation

## 2021-12-03 DIAGNOSIS — F319 Bipolar disorder, unspecified: Secondary | ICD-10-CM | POA: Diagnosis not present

## 2021-12-03 DIAGNOSIS — F419 Anxiety disorder, unspecified: Secondary | ICD-10-CM | POA: Diagnosis not present

## 2021-12-03 HISTORY — PX: IMPLANTATION OF HYPOGLOSSAL NERVE STIMULATOR: SHX6827

## 2021-12-03 SURGERY — INSERTION, HYPOGLOSSAL NERVE STIMULATOR
Anesthesia: General | Site: Neck | Laterality: Right

## 2021-12-03 MED ORDER — HYDROMORPHONE HCL 1 MG/ML IJ SOLN
INTRAMUSCULAR | Status: AC
  Filled 2021-12-03: qty 0.5

## 2021-12-03 MED ORDER — FENTANYL CITRATE (PF) 100 MCG/2ML IJ SOLN
25.0000 ug | INTRAMUSCULAR | Status: DC | PRN
  Administered 2021-12-03: 50 ug via INTRAVENOUS

## 2021-12-03 MED ORDER — ACETAMINOPHEN 10 MG/ML IV SOLN
1000.0000 mg | Freq: Once | INTRAVENOUS | Status: DC | PRN
  Administered 2021-12-03: 1000 mg via INTRAVENOUS

## 2021-12-03 MED ORDER — ROCURONIUM BROMIDE 10 MG/ML (PF) SYRINGE
PREFILLED_SYRINGE | INTRAVENOUS | Status: AC
  Filled 2021-12-03: qty 10

## 2021-12-03 MED ORDER — PHENYLEPHRINE 80 MCG/ML (10ML) SYRINGE FOR IV PUSH (FOR BLOOD PRESSURE SUPPORT)
PREFILLED_SYRINGE | INTRAVENOUS | Status: DC | PRN
  Administered 2021-12-03: 80 ug via INTRAVENOUS

## 2021-12-03 MED ORDER — MIDAZOLAM HCL 2 MG/2ML IJ SOLN
INTRAMUSCULAR | Status: DC | PRN
  Administered 2021-12-03: 2 mg via INTRAVENOUS

## 2021-12-03 MED ORDER — ONDANSETRON HCL 4 MG/2ML IJ SOLN
INTRAMUSCULAR | Status: AC
  Filled 2021-12-03: qty 6

## 2021-12-03 MED ORDER — AMISULPRIDE (ANTIEMETIC) 5 MG/2ML IV SOLN
INTRAVENOUS | Status: AC
  Filled 2021-12-03: qty 4

## 2021-12-03 MED ORDER — LIDOCAINE-EPINEPHRINE 1 %-1:100000 IJ SOLN
INTRAMUSCULAR | Status: AC
  Filled 2021-12-03: qty 1

## 2021-12-03 MED ORDER — AMISULPRIDE (ANTIEMETIC) 5 MG/2ML IV SOLN
10.0000 mg | Freq: Once | INTRAVENOUS | Status: AC
  Administered 2021-12-03: 10 mg via INTRAVENOUS

## 2021-12-03 MED ORDER — FENTANYL CITRATE (PF) 250 MCG/5ML IJ SOLN
INTRAMUSCULAR | Status: DC | PRN
  Administered 2021-12-03: 50 ug via INTRAVENOUS
  Administered 2021-12-03: 75 ug via INTRAVENOUS
  Administered 2021-12-03: 125 ug via INTRAVENOUS

## 2021-12-03 MED ORDER — HYDROCODONE-ACETAMINOPHEN 5-325 MG PO TABS: 1.0000 | ORAL_TABLET | Freq: Four times a day (QID) | ORAL | 0 refills | Status: AC | PRN

## 2021-12-03 MED ORDER — SUCCINYLCHOLINE CHLORIDE 200 MG/10ML IV SOSY
PREFILLED_SYRINGE | INTRAVENOUS | Status: DC | PRN
  Administered 2021-12-03: 160 mg via INTRAVENOUS

## 2021-12-03 MED ORDER — PROPOFOL 10 MG/ML IV BOLUS
INTRAVENOUS | Status: DC | PRN
  Administered 2021-12-03: 160 mg via INTRAVENOUS

## 2021-12-03 MED ORDER — LIDOCAINE-EPINEPHRINE 1 %-1:100000 IJ SOLN
INTRAMUSCULAR | Status: DC | PRN
  Administered 2021-12-03: 5 mL

## 2021-12-03 MED ORDER — HYDROMORPHONE HCL 1 MG/ML IJ SOLN
INTRAMUSCULAR | Status: DC | PRN
  Administered 2021-12-03: .5 mg via INTRAVENOUS

## 2021-12-03 MED ORDER — PROPOFOL 10 MG/ML IV BOLUS
INTRAVENOUS | Status: AC
  Filled 2021-12-03: qty 20

## 2021-12-03 MED ORDER — CHLORHEXIDINE GLUCONATE 0.12 % MT SOLN
15.0000 mL | Freq: Once | OROMUCOSAL | Status: AC
  Administered 2021-12-03: 15 mL via OROMUCOSAL
  Filled 2021-12-03: qty 15

## 2021-12-03 MED ORDER — VANCOMYCIN HCL 1500 MG/300ML IV SOLN
1500.0000 mg | INTRAVENOUS | Status: AC
  Administered 2021-12-03: 1500 mg via INTRAVENOUS
  Filled 2021-12-03 (×2): qty 300

## 2021-12-03 MED ORDER — ONDANSETRON HCL 4 MG/2ML IJ SOLN
INTRAMUSCULAR | Status: DC | PRN
  Administered 2021-12-03: 4 mg via INTRAVENOUS

## 2021-12-03 MED ORDER — DEXAMETHASONE SODIUM PHOSPHATE 10 MG/ML IJ SOLN
INTRAMUSCULAR | Status: DC | PRN
  Administered 2021-12-03: 10 mg via INTRAVENOUS

## 2021-12-03 MED ORDER — LIDOCAINE 2% (20 MG/ML) 5 ML SYRINGE
INTRAMUSCULAR | Status: AC
  Filled 2021-12-03: qty 10

## 2021-12-03 MED ORDER — ORAL CARE MOUTH RINSE: 15.0000 mL | Freq: Once | OROMUCOSAL | Status: AC

## 2021-12-03 MED ORDER — PHENYLEPHRINE HCL-NACL 20-0.9 MG/250ML-% IV SOLN
INTRAVENOUS | Status: DC | PRN
  Administered 2021-12-03: 30 ug/min via INTRAVENOUS

## 2021-12-03 MED ORDER — FENTANYL CITRATE (PF) 250 MCG/5ML IJ SOLN
INTRAMUSCULAR | Status: AC
  Filled 2021-12-03: qty 5

## 2021-12-03 MED ORDER — FENTANYL CITRATE (PF) 100 MCG/2ML IJ SOLN
INTRAMUSCULAR | Status: AC
  Filled 2021-12-03: qty 2

## 2021-12-03 MED ORDER — DEXAMETHASONE SODIUM PHOSPHATE 10 MG/ML IJ SOLN
INTRAMUSCULAR | Status: AC
  Filled 2021-12-03: qty 3

## 2021-12-03 MED ORDER — LACTATED RINGERS IV SOLN: INTRAVENOUS | Status: DC

## 2021-12-03 MED ORDER — LIDOCAINE 2% (20 MG/ML) 5 ML SYRINGE
INTRAMUSCULAR | Status: DC | PRN
  Administered 2021-12-03: 60 mg via INTRAVENOUS

## 2021-12-03 MED ORDER — MIDAZOLAM HCL 2 MG/2ML IJ SOLN
INTRAMUSCULAR | Status: AC
  Filled 2021-12-03: qty 2

## 2021-12-03 MED ORDER — ACETAMINOPHEN 10 MG/ML IV SOLN
INTRAVENOUS | Status: AC
  Filled 2021-12-03: qty 100

## 2021-12-03 MED ORDER — ACETAMINOPHEN 500 MG PO TABS: 1000.0000 mg | ORAL_TABLET | Freq: Once | ORAL | Status: DC | PRN

## 2021-12-03 MED ORDER — ACETAMINOPHEN 160 MG/5ML PO SOLN: 1000.0000 mg | Freq: Once | ORAL | Status: DC | PRN

## 2021-12-03 MED ORDER — 0.9 % SODIUM CHLORIDE (POUR BTL) OPTIME
TOPICAL | Status: DC | PRN
  Administered 2021-12-03: 1000 mL

## 2021-12-03 SURGICAL SUPPLY — 63 items
BLADE CLIPPER SURG (BLADE) IMPLANT
BLADE SURG 15 STRL LF DISP TIS (BLADE) ×3 IMPLANT
BLADE SURG 15 STRL SS (BLADE) ×1
CANISTER SUCT 3000ML PPV (MISCELLANEOUS) ×1 IMPLANT
CORD BIPOLAR FORCEPS 12FT (ELECTRODE) ×1 IMPLANT
COVER PROBE W GEL 5X96 (DRAPES) ×1 IMPLANT
COVER SURGICAL LIGHT HANDLE (MISCELLANEOUS) ×1 IMPLANT
DERMABOND ADVANCED .7 DNX12 (GAUZE/BANDAGES/DRESSINGS) ×2 IMPLANT
DRAPE C-ARM 35X43 STRL (DRAPES) ×1 IMPLANT
DRAPE HEAD BAR (DRAPES) ×1 IMPLANT
DRAPE INCISE IOBAN 66X45 STRL (DRAPES) ×1 IMPLANT
DRAPE MICROSCOPE LEICA 54X105 (DRAPES) ×1 IMPLANT
DRAPE UTILITY XL STRL (DRAPES) ×1 IMPLANT
DRSG TEGADERM 4X4.75 (GAUZE/BANDAGES/DRESSINGS) ×3 IMPLANT
ELECT COATED BLADE 2.86 ST (ELECTRODE) ×1 IMPLANT
ELECT EMG 18 NIMS (NEUROSURGERY SUPPLIES) ×1
ELECT REM PT RETURN 9FT ADLT (ELECTROSURGICAL) ×1
ELECTRODE EMG 18 NIMS (NEUROSURGERY SUPPLIES) ×1 IMPLANT
ELECTRODE REM PT RTRN 9FT ADLT (ELECTROSURGICAL) ×1 IMPLANT
FORCEPS BIPOLAR SPETZLER 8 1.0 (NEUROSURGERY SUPPLIES) ×1 IMPLANT
GAUZE 4X4 16PLY ~~LOC~~+RFID DBL (SPONGE) ×1 IMPLANT
GAUZE SPONGE 4X4 12PLY STRL (GAUZE/BANDAGES/DRESSINGS) ×1 IMPLANT
GENERATOR PULSE INSPIRE (Generator) ×1 IMPLANT
GENERATOR PULSE INSPIRE IV (Generator) ×1 IMPLANT
GLOVE BIO SURGEON STRL SZ 6.5 (GLOVE) IMPLANT
GLOVE BIO SURGEON STRL SZ7.5 (GLOVE) ×1 IMPLANT
GOWN STRL REUS W/ TWL LRG LVL3 (GOWN DISPOSABLE) ×3 IMPLANT
GOWN STRL REUS W/TWL LRG LVL3 (GOWN DISPOSABLE) ×3
KIT BASIN OR (CUSTOM PROCEDURE TRAY) ×1 IMPLANT
KIT TURNOVER KIT B (KITS) ×1 IMPLANT
LEAD SENSING RESP INSPIRE (Lead) ×1 IMPLANT
LEAD SENSING RESP INSPIRE IV (Lead) ×1 IMPLANT
LEAD SLEEP STIM INSPIRE IV/V (Lead) ×1 IMPLANT
LEAD SLEEP STIMULATION INSPIRE (Lead) ×1 IMPLANT
LOOP VESSEL MAXI BLUE (MISCELLANEOUS) ×1 IMPLANT
LOOP VESSEL MINI RED (MISCELLANEOUS) ×1 IMPLANT
MARKER SKIN DUAL TIP RULER LAB (MISCELLANEOUS) ×2 IMPLANT
NDL HYPO 25GX1X1/2 BEV (NEEDLE) ×1 IMPLANT
NEEDLE HYPO 25GX1X1/2 BEV (NEEDLE) ×1 IMPLANT
NS IRRIG 1000ML POUR BTL (IV SOLUTION) ×1 IMPLANT
PAD ARMBOARD 7.5X6 YLW CONV (MISCELLANEOUS) ×1 IMPLANT
PASSER CATH 38CM DISP (INSTRUMENTS) ×1 IMPLANT
PENCIL SMOKE EVACUATOR (MISCELLANEOUS) ×1 IMPLANT
POSITIONER HEAD DONUT 9IN (MISCELLANEOUS) ×1 IMPLANT
PROBE NERVE STIMULATOR (NEUROSURGERY SUPPLIES) ×1 IMPLANT
REMOTE CONTROL SLEEP INSPIRE (MISCELLANEOUS) ×1 IMPLANT
SET WALTER ACTIVATION W/DRAPE (SET/KITS/TRAYS/PACK) ×1 IMPLANT
SPONGE INTESTINAL PEANUT (DISPOSABLE) ×1 IMPLANT
STAPLER VISISTAT 35W (STAPLE) ×1 IMPLANT
SUT SILK 2 0 SH (SUTURE) ×1 IMPLANT
SUT SILK 3 0 REEL (SUTURE) ×1 IMPLANT
SUT SILK 3 0 SH 30 (SUTURE) ×2 IMPLANT
SUT SILK 3-0 (SUTURE) ×1
SUT SILK 3-0 RB1 30XBRD (SUTURE) ×1
SUT VIC AB 3-0 SH 27 (SUTURE) ×2
SUT VIC AB 3-0 SH 27X BRD (SUTURE) ×2 IMPLANT
SUT VIC AB 4-0 PS2 27 (SUTURE) ×2 IMPLANT
SUTURE SILK 3-0 RB1 30XBRD (SUTURE) ×1 IMPLANT
SYR 10ML LL (SYRINGE) ×1 IMPLANT
TAPE CLOTH SURG 4X10 WHT LF (GAUZE/BANDAGES/DRESSINGS) ×1 IMPLANT
TOWEL GREEN STERILE (TOWEL DISPOSABLE) ×1 IMPLANT
TRAY ENT MC OR (CUSTOM PROCEDURE TRAY) ×1 IMPLANT
WATER STERILE IRR 1000ML POUR (IV SOLUTION) IMPLANT

## 2021-12-03 NOTE — Anesthesia Preprocedure Evaluation (Addendum)
Anesthesia Evaluation  Patient identified by MRN, date of birth, ID band Patient awake    Reviewed: Allergy & Precautions, NPO status , Patient's Chart, lab work & pertinent test results  Airway Mallampati: IV  TM Distance: <3 FB Neck ROM: Full    Dental  (+) Teeth Intact, Dental Advisory Given   Pulmonary sleep apnea , Current Smoker and Patient abstained from smoking.   breath sounds clear to auscultation       Cardiovascular negative cardio ROS  Rhythm:Regular Rate:Normal     Neuro/Psych  PSYCHIATRIC DISORDERS Anxiety  Bipolar Disorder   negative neurological ROS     GI/Hepatic Neg liver ROS,GERD  Medicated,,  Endo/Other  negative endocrine ROS  Obesity   Renal/GU negative Renal ROS     Musculoskeletal negative musculoskeletal ROS (+)    Abdominal   Peds  Hematology negative hematology ROS (+)   Anesthesia Other Findings Day of surgery medications reviewed with the patient.  Reproductive/Obstetrics                             Anesthesia Physical Anesthesia Plan  ASA: 2  Anesthesia Plan: General   Post-op Pain Management: Ofirmev IV (intra-op)*   Induction: Intravenous  PONV Risk Score and Plan: 1 and Ondansetron and Dexamethasone  Airway Management Planned: Oral ETT and Video Laryngoscope Planned  Additional Equipment: None  Intra-op Plan:   Post-operative Plan: Extubation in OR  Informed Consent: I have reviewed the patients History and Physical, chart, labs and discussed the procedure including the risks, benefits and alternatives for the proposed anesthesia with the patient or authorized representative who has indicated his/her understanding and acceptance.     Dental advisory given  Plan Discussed with: CRNA  Anesthesia Plan Comments:         Anesthesia Quick Evaluation

## 2021-12-03 NOTE — Op Note (Signed)

## 2021-12-03 NOTE — Transfer of Care (Signed)
Immediate Anesthesia Transfer of Care Note  Patient: Brian Pruitt  Procedure(s) Performed: IMPLANTATION OF HYPOGLOSSAL NERVE STIMULATOR (Right: Neck)  Patient Location: PACU  Anesthesia Type:General  Level of Consciousness: drowsy and patient cooperative  Airway & Oxygen Therapy: Patient Spontanous Breathing and Patient connected to face mask oxygen  Post-op Assessment: Report given to RN, Post -op Vital signs reviewed and stable, and Patient moving all extremities X 4  Post vital signs: Reviewed and stable  Last Vitals:  Vitals Value Taken Time  BP 159/109   Temp    Pulse 83   Resp 14   SpO2 98     Last Pain:  Vitals:   12/03/21 0728  TempSrc:   PainSc: 0-No pain         Complications:  Encounter Notable Events  Notable Event Outcome Phase Comment  Difficult to intubate - expected  Intraprocedure Filed from anesthesia note documentation.

## 2021-12-03 NOTE — Anesthesia Procedure Notes (Signed)
Procedure Name: Intubation Date/Time: 12/03/2021 9:02 AM  Performed by: Alease Medina, CRNAPre-anesthesia Checklist: Patient identified, Emergency Drugs available, Suction available and Patient being monitored Patient Re-evaluated:Patient Re-evaluated prior to induction Oxygen Delivery Method: Circle system utilized Preoxygenation: Pre-oxygenation with 100% oxygen Induction Type: IV induction Ventilation: Mask ventilation without difficulty Laryngoscope Size: Glidescope and 4 Grade View: Grade I Tube type: Oral Tube size: 7.5 mm Number of attempts: 1 Airway Equipment and Method: Stylet and Oral airway Placement Confirmation: ETT inserted through vocal cords under direct vision, positive ETCO2 and breath sounds checked- equal and bilateral Secured at: 22 cm Tube secured with: Tape Dental Injury: Teeth and Oropharynx as per pre-operative assessment  Difficulty Due To: Difficulty was anticipated and Difficult Airway- due to anterior larynx

## 2021-12-03 NOTE — Progress Notes (Signed)
Unable to waste Fentanyl in PIXYS. Fentanyl 50 mcg wasted with Kennyth Arnold, RN

## 2021-12-03 NOTE — H&P (Signed)
Brian Pruitt is an 41 y.o. male.   Chief Complaint: Sleep apnea HPI: 41 year old male with obstructive sleep apnea who has been unable to tolerate CPAP.  Past Medical History:  Diagnosis Date   Bipolar disorder (HCC)    Depression    GERD (gastroesophageal reflux disease)    HLD (hyperlipidemia)    Panic disorder    Sleep apnea     Past Surgical History:  Procedure Laterality Date   DRUG INDUCED ENDOSCOPY Bilateral 10/01/2021   Procedure: DRUG INDUCED ENDOSCOPY;  Surgeon: Christia Reading, MD;  Location: [facility];  Service: ENT;  Laterality: Bilateral;   FRACTURE SURGERY Left 2020   4 pins in left foot   ORIF CLAVICLE FRACTURE  2009   ORIF RADIAL FRACTURE  2009   SEPTOPLASTY  2015   TONSILLECTOMY     as a child    History reviewed. No pertinent family history. Social History:  reports that he has been smoking cigarettes. He uses smokeless tobacco. He reports current alcohol use. He reports current drug use. Drug: Marijuana.  Allergies:  Allergies  Allergen Reactions   Septra [Sulfamethoxazole-Trimethoprim] Hives   Trimethoprim    Ceclor [Cefaclor] Rash    Medications Prior to Admission  Medication Sig Dispense Refill   ALPRAZolam (XANAX) 0.25 MG tablet Take 0.25 mg by mouth 2 (two) times daily as needed for anxiety.     atorvastatin (LIPITOR) 40 MG tablet Take 40 mg by mouth at bedtime.     cariprazine (VRAYLAR) 3 MG capsule Take 3 mg by mouth at bedtime.     clobetasol cream (TEMOVATE) 0.05 % Apply 1 Application topically daily as needed (irritation).     divalproex (DEPAKOTE ER) 500 MG 24 hr tablet Take 500 mg by mouth at bedtime.     DUPIXENT 300 MG/2ML prefilled syringe Inject 300 mg into the skin every 14 (fourteen) days.     ibuprofen (ADVIL) 200 MG tablet Take 400 mg by mouth every 6 (six) hours as needed for moderate pain.     pantoprazole (PROTONIX) 40 MG tablet Take 40 mg by mouth daily.     QUEtiapine (SEROQUEL XR) 300 MG 24 hr tablet Take 300  mg by mouth at bedtime.     sertraline (ZOLOFT) 100 MG tablet Take 200 mg by mouth in the morning.     tacrolimus (PROTOPIC) 0.1 % ointment Apply 1 Application topically daily as needed (irritation).      No results found for this or any previous visit (from the past 48 hour(s)). No results found.  Review of Systems  All other systems reviewed and are negative.   Blood pressure (!) 158/104, pulse 95, temperature (!) 97.1 F (36.2 C), temperature source Oral, resp. rate 18, height 5\' 11"  (1.803 m), weight 103.4 kg, SpO2 96 %. Physical Exam Constitutional:      Appearance: Normal appearance. He is normal weight.  HENT:     Head: Normocephalic and atraumatic.     Right Ear: External ear normal.     Left Ear: External ear normal.     Nose: Nose normal.     Mouth/Throat:     Mouth: Mucous membranes are moist.     Pharynx: Oropharynx is clear.  Eyes:     Extraocular Movements: Extraocular movements intact.     Conjunctiva/sclera: Conjunctivae normal.     Pupils: Pupils are equal, round, and reactive to light.  Cardiovascular:     Rate and Rhythm: Normal rate.  Pulmonary:  Effort: Pulmonary effort is normal.  Musculoskeletal:     Cervical back: Normal range of motion.  Skin:    General: Skin is warm and dry.  Neurological:     General: No focal deficit present.     Mental Status: He is alert and oriented to person, place, and time.  Psychiatric:        Mood and Affect: Mood normal.        Behavior: Behavior normal.        Thought Content: Thought content normal.        Judgment: Judgment normal.      Assessment/Plan Obstructive sleep apnea and BMI 31.80  To OR for hypoglossal nerve stimulator placement.  Christia Reading, MD 12/03/2021, 8:30 AM

## 2021-12-03 NOTE — Brief Op Note (Signed)
12/03/2021  11:09 AM  PATIENT:  Brian Pruitt  41 y.o. male  PRE-OPERATIVE DIAGNOSIS:  Obstructive Sleep Apnea BMI 32.0-32.9,adult  POST-OPERATIVE DIAGNOSIS:  Obstructive Sleep ApneaBMI 32.0-32.9,adult  PROCEDURE:  Procedure(s) with comments: IMPLANTATION OF HYPOGLOSSAL NERVE STIMULATOR (Right) - and Chest  SURGEON:  Surgeon(s) and Role:    Christia Reading, MD - Primary  PHYSICIAN ASSISTANT:   ASSISTANTS: none   ANESTHESIA:   general  EBL:  10 mL   BLOOD ADMINISTERED:none  DRAINS: none   LOCAL MEDICATIONS USED:  LIDOCAINE   SPECIMEN:  No Specimen  DISPOSITION OF SPECIMEN:  N/A  COUNTS:  YES  TOURNIQUET:  * No tourniquets in log *  DICTATION: .Note written in EPIC  PLAN OF CARE: Discharge to home after PACU  PATIENT DISPOSITION:  PACU - hemodynamically stable.   Delay start of Pharmacological VTE agent (>24hrs) due to surgical blood loss or risk of bleeding: no

## 2021-12-04 ENCOUNTER — Encounter (HOSPITAL_COMMUNITY): Payer: Self-pay | Admitting: Otolaryngology

## 2021-12-07 NOTE — Anesthesia Postprocedure Evaluation (Signed)
Anesthesia Post Note  Patient: Darric Plante  Procedure(s) Performed: IMPLANTATION OF HYPOGLOSSAL NERVE STIMULATOR (Right: Neck)     Patient location during evaluation: PACU Anesthesia Type: General Level of consciousness: awake and alert Pain management: pain level controlled Vital Signs Assessment: post-procedure vital signs reviewed and stable Respiratory status: spontaneous breathing, nonlabored ventilation and respiratory function stable Cardiovascular status: blood pressure returned to baseline and stable Postop Assessment: no apparent nausea or vomiting Anesthetic complications: yes  Encounter Notable Events  Notable Event Outcome Phase Comment  Difficult to intubate - expected  Intraprocedure Filed from anesthesia note documentation.    Last Vitals:  Vitals:   12/03/21 1230 12/03/21 1245  BP: (!) 155/102 (!) 157/107  Pulse: 91 89  Resp: 15 14  Temp:  (!) 36.2 C  SpO2: 97% 96%    Last Pain:  Vitals:   12/03/21 1245  TempSrc:   PainSc: 4    Pain Goal:                   Iliza Blankenbeckler
